# Patient Record
Sex: Female | Born: 2000 | Race: White | Hispanic: No | Marital: Single
Health system: Midwestern US, Community
[De-identification: ages and names within clinical notes are randomized; demographics above are authoritative.]

## PROBLEM LIST (undated history)

## (undated) DIAGNOSIS — D649 Anemia, unspecified: Secondary | ICD-10-CM

## (undated) DIAGNOSIS — R002 Palpitations: Secondary | ICD-10-CM

## (undated) DIAGNOSIS — S060X9A Concussion with loss of consciousness of unspecified duration, initial encounter: Secondary | ICD-10-CM

## (undated) HISTORY — PX: HAND SURGERY: SHX662

## (undated) HISTORY — DX: Palpitations: R00.2

## (undated) HISTORY — DX: Anemia, unspecified: D64.9

---
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
## 2018-03-23 ENCOUNTER — Other Ambulatory Visit: Payer: Self-pay | Admitting: Physician Assistant

## 2018-03-23 ENCOUNTER — Ambulatory Visit (INDEPENDENT_AMBULATORY_CARE_PROVIDER_SITE_OTHER): Payer: Managed Care, Other (non HMO)

## 2018-03-23 DIAGNOSIS — R002 Palpitations: Secondary | ICD-10-CM

## 2018-06-21 DIAGNOSIS — K011 Impacted teeth: Secondary | ICD-10-CM | POA: Diagnosis not present

## 2018-06-21 DIAGNOSIS — K006 Disturbances in tooth eruption: Secondary | ICD-10-CM | POA: Diagnosis not present

## 2018-09-25 DIAGNOSIS — Z30017 Encounter for initial prescription of implantable subdermal contraceptive: Secondary | ICD-10-CM | POA: Diagnosis not present

## 2018-09-25 DIAGNOSIS — Z3202 Encounter for pregnancy test, result negative: Secondary | ICD-10-CM | POA: Diagnosis not present

## 2018-09-26 ENCOUNTER — Telehealth: Payer: Self-pay | Admitting: Cardiovascular Disease

## 2018-09-26 NOTE — Telephone Encounter (Signed)
error 

## 2018-10-01 ENCOUNTER — Ambulatory Visit: Payer: Managed Care, Other (non HMO) | Admitting: Cardiovascular Disease

## 2018-10-24 ENCOUNTER — Telehealth: Payer: Self-pay | Admitting: *Deleted

## 2018-10-24 NOTE — Telephone Encounter (Signed)
Called pt to prescreen and she advised that she has already seen another Cardiologist and just to cancel the apt.

## 2018-10-25 ENCOUNTER — Ambulatory Visit: Payer: Managed Care, Other (non HMO) | Admitting: Cardiology

## 2018-10-31 ENCOUNTER — Emergency Department: Admit: 2018-10-31 | Payer: BLUE CROSS/BLUE SHIELD | Primary: Family Medicine

## 2018-10-31 ENCOUNTER — Inpatient Hospital Stay
Admit: 2018-10-31 | Discharge: 2018-11-01 | Disposition: A | Discharge status: Home / Self Care | Payer: BLUE CROSS/BLUE SHIELD | Attending: Emergency Medicine

## 2018-10-31 DIAGNOSIS — R0789 Other chest pain: Secondary | ICD-10-CM

## 2018-10-31 LAB — DRUG SCREEN, URINE
AMPHETAMINES: NEGATIVE
Amphetamine Screen, Urine: NEGATIVE
BARBITURATES: NEGATIVE
BENZODIAZEPINES: NEGATIVE
Barbiturate Screen, Urine: NEGATIVE
Benzodiazepine Screen, Urine: NEGATIVE
COCAINE: NEGATIVE
Cocaine Screen Urine: NEGATIVE
METHADONE: NEGATIVE
Methadone Screen, Urine: NEGATIVE
OPIATES: NEGATIVE
Opiate Screen, Urine: NEGATIVE
PCP Screen, Urine: NEGATIVE
PCP(PHENCYCLIDINE): NEGATIVE
THC (TH-CANNABINOL): NEGATIVE
THC Screen, Urine: NEGATIVE

## 2018-10-31 LAB — COMPREHENSIVE METABOLIC PANEL
ALT: 15 U/L (ref 12–78)
AST: 8 U/L — ABNORMAL LOW (ref 15–37)
Albumin/Globulin Ratio: 1.2 (ref 1.1–2.2)
Albumin: 4.3 g/dL (ref 3.5–5.0)
Alkaline Phosphatase: 77 U/L (ref 45–117)
Anion Gap: 3 mmol/L — ABNORMAL LOW (ref 5–15)
BUN: 10 MG/DL (ref 6–20)
Bun/Cre Ratio: 11 — ABNORMAL LOW (ref 12–20)
CO2: 28 mmol/L (ref 21–32)
Calcium: 9.1 MG/DL (ref 8.5–10.1)
Chloride: 107 mmol/L (ref 97–108)
Creatinine: 0.89 MG/DL (ref 0.55–1.02)
EGFR IF NonAfrican American: >60 mL/min/{1.73_m2} (ref >60)
GFR African American: >60 mL/min/{1.73_m2} (ref >60)
Globulin: 3.7 g/dL (ref 2.0–4.0)
Glucose: 97 mg/dL (ref 65–100)
Potassium: 4.1 mmol/L (ref 3.5–5.1)
Sodium: 138 mmol/L (ref 136–145)
Total Bilirubin: 0.2 MG/DL (ref 0.2–1.0)
Total Protein: 8 g/dL (ref 6.4–8.2)

## 2018-10-31 LAB — CBC WITH AUTO DIFFERENTIAL
Basophils %: 1 % (ref 0–1)
Basophils Absolute: 0 10*3/uL (ref 0.0–0.1)
Eosinophils %: 3 % (ref 0–7)
Eosinophils Absolute: 0.1 10*3/uL (ref 0.0–0.4)
Granulocyte Absolute Count: 0 10*3/uL (ref 0.00–0.04)
Hematocrit: 39.7 % (ref 35.0–47.0)
Hemoglobin: 12.3 g/dL (ref 11.5–16.0)
Immature Granulocytes: 0 % (ref 0.0–0.5)
Lymphocytes %: 30 % (ref 12–49)
Lymphocytes Absolute: 1.5 10*3/uL (ref 0.8–3.5)
MCH: 24.8 PG — ABNORMAL LOW (ref 26.0–34.0)
MCHC: 31 g/dL (ref 30.0–36.5)
MCV: 80.2 FL (ref 80.0–99.0)
MPV: 10.5 FL (ref 8.9–12.9)
Monocytes %: 8 % (ref 5–13)
Monocytes Absolute: 0.4 10*3/uL (ref 0.0–1.0)
NRBC Absolute: 0 10*3/uL (ref 0.00–0.01)
Neutrophils %: 58 % (ref 32–75)
Neutrophils Absolute: 2.9 10*3/uL (ref 1.8–8.0)
Nucleated RBCs: 0 PER 100 WBC
Platelets: 282 10*3/uL (ref 150–400)
RBC: 4.95 M/uL (ref 3.80–5.20)
RDW: 14.6 % — ABNORMAL HIGH (ref 11.5–14.5)
WBC: 5 10*3/uL (ref 3.6–11.0)

## 2018-10-31 LAB — CK W/ REFLX CKMB
CK: 105 U/L (ref 26–192)
CK: 105 U/L (ref 26–192)

## 2018-10-31 LAB — ETHYL ALCOHOL
ALCOHOL(ETHYL),SERUM: <10 MG/DL (ref <10)
Ethyl Alcohol: <10 MG/DL (ref <10)

## 2018-10-31 LAB — TROPONIN: Troponin I: <0.05 ng/mL (ref <0.05)

## 2018-10-31 LAB — D-DIMER, QUANTITATIVE: D-Dimer, Quant: 0.22 mg/L FEU (ref 0.00–0.65)

## 2018-10-31 LAB — CBC WITH AUTOMATED DIFF
ABS. BASOPHILS: 0 10*3/uL (ref 0.0–0.1)
ABS. EOSINOPHILS: 0.1 10*3/uL (ref 0.0–0.4)
ABS. IMM. GRANS.: 0 10*3/uL (ref 0.00–0.04)
ABS. LYMPHOCYTES: 1.5 10*3/uL (ref 0.8–3.5)
ABS. MONOCYTES: 0.4 10*3/uL (ref 0.0–1.0)
ABS. NEUTROPHILS: 2.9 10*3/uL (ref 1.8–8.0)
ABSOLUTE NRBC: 0 10*3/uL (ref 0.00–0.01)
BASOPHILS: 1 % (ref 0–1)
EOSINOPHILS: 3 % (ref 0–7)
HCT: 39.7 % (ref 35.0–47.0)
HGB: 12.3 g/dL (ref 11.5–16.0)
IMMATURE GRANULOCYTES: 0 % (ref 0.0–0.5)
LYMPHOCYTES: 30 % (ref 12–49)
MCH: 24.8 PG — ABNORMAL LOW (ref 26.0–34.0)
MCHC: 31 g/dL (ref 30.0–36.5)
MCV: 80.2 FL (ref 80.0–99.0)
MONOCYTES: 8 % (ref 5–13)
MPV: 10.5 FL (ref 8.9–12.9)
NEUTROPHILS: 58 % (ref 32–75)
NRBC: 0 PER 100 WBC
PLATELET: 282 10*3/uL (ref 150–400)
RBC: 4.95 M/uL (ref 3.80–5.20)
RDW: 14.6 % — ABNORMAL HIGH (ref 11.5–14.5)
WBC: 5 10*3/uL (ref 3.6–11.0)

## 2018-10-31 LAB — METABOLIC PANEL, COMPREHENSIVE
A-G Ratio: 1.2 (ref 1.1–2.2)
ALT (SGPT): 15 U/L (ref 12–78)
AST (SGOT): 8 U/L — ABNORMAL LOW (ref 15–37)
Albumin: 4.3 g/dL (ref 3.5–5.0)
Alk. phosphatase: 77 U/L (ref 45–117)
Anion gap: 3 mmol/L — ABNORMAL LOW (ref 5–15)
BUN/Creatinine ratio: 11 — ABNORMAL LOW (ref 12–20)
BUN: 10 MG/DL (ref 6–20)
Bilirubin, total: 0.2 MG/DL (ref 0.2–1.0)
CO2: 28 mmol/L (ref 21–32)
Calcium: 9.1 MG/DL (ref 8.5–10.1)
Chloride: 107 mmol/L (ref 97–108)
Creatinine: 0.89 MG/DL (ref 0.55–1.02)
GFR est AA: >60 mL/min/{1.73_m2} (ref >60)
GFR est non-AA: >60 mL/min/{1.73_m2} (ref >60)
Globulin: 3.7 g/dL (ref 2.0–4.0)
Glucose: 97 mg/dL (ref 65–100)
Potassium: 4.1 mmol/L (ref 3.5–5.1)
Protein, total: 8 g/dL (ref 6.4–8.2)
Sodium: 138 mmol/L (ref 136–145)

## 2018-10-31 LAB — TROPONIN I: Troponin-I, Qt.: <0.05 ng/mL (ref <0.05)

## 2018-10-31 LAB — D DIMER: D-dimer: 0.22 mg/L FEU (ref 0.00–0.65)

## 2018-10-31 NOTE — ED Notes (Signed)
Patient was provided with discharge instructions. Instructions and any medications were reviewed with the patient &/or family by Dr. Keefer. Questions and concerns addressed by the provider. Patient discharged in stable condition via ambulation and was escorted by Friend.

## 2018-10-31 NOTE — ED Provider Notes (Signed)
EMERGENCY DEPARTMENT HISTORY AND PHYSICAL EXAM      Date: 10/31/2018  Patient Name: Maureen Stevens    History of Presenting Illness     Chief Complaint   Patient presents with   ??? Chest Pain     Patient presents to the ED complaining of worsening left sided chest pain. Patient is currenly wearing a Holter monitor and under the care of Verl Dicker, MD, [facility]. Patient acknowledges dizziness and nausea.        History Provided By: Patient    HPI: Maureen Stevens, 18 y.o. female without significant medical history presents to the ED with cc of chest pain.  Patient complains of an atypical chest pain described as electrical shock sensations that will occur intermittently.  She states that symptoms started in January of this year and that they started out occurring only once every other week.  Now she is having approximately 8 episodes a day.  She states that she feels that she is gasping for air and she feels her heart racing afterwards.  Her friend at bedside states that she has exercise fatigue, dyspnea on exertion, and general lack of energy.  Normal appetite.  She states that she has also felt confused recently.  She denies any fevers, chills, head injury, recent illness.  She denies abdominal pain, nausea, vomiting, or significant medical or surgical history.  She has been following with Dr. Daniel Nones, cardiology, and is currently being evaluated by Holter monitor which she is wearing.  She denies any alcohol use or drug use.  Patient states that she has been evaluated by multiple doctors for this condition and has not received any answer, they come to the ED today as her symptoms are worsening.    There are no other complaints, changes, or physical findings at this time.    PCP: Other, Phys, MD    No current facility-administered medications on file prior to encounter.      No current outpatient medications on file prior to encounter.       Past History     Past Medical History:   History reviewed. No pertinent past medical history.    Past Surgical History:  History reviewed. No pertinent surgical history.    Family History:  History reviewed. No pertinent family history.    Social History:  Social History     Tobacco Use   ??? Smoking status: Not on file   Substance Use Topics   ??? Alcohol use: Not on file   ??? Drug use: Not on file       Allergies:  No Known Allergies      Review of Systems   Review of Systems   Constitutional: Positive for fatigue. Negative for chills and fever.   HENT: Negative.    Eyes: Negative for visual disturbance.   Respiratory: Positive for shortness of breath. Negative for cough.    Cardiovascular: Positive for chest pain. Negative for leg swelling.   Gastrointestinal: Negative for abdominal pain, nausea and vomiting.   Genitourinary: Negative.    Musculoskeletal: Negative for back pain and gait problem.   Skin: Negative for color change and rash.   Neurological: Negative for dizziness, weakness, light-headedness and headaches.   Hematological: Does not bruise/bleed easily.   Psychiatric/Behavioral: Positive for confusion.   All other systems reviewed and are negative.      Physical Exam   Physical Exam  Vitals signs and nursing note reviewed.   Constitutional:       General:  She is not in acute distress.     Appearance: Normal appearance. She is not ill-appearing or toxic-appearing.      Comments: Patient appears intoxicated with slurred speech   HENT:      Head: Normocephalic and atraumatic.      Nose: Nose normal.      Mouth/Throat:      Mouth: Mucous membranes are moist.   Eyes:      Extraocular Movements: Extraocular movements intact.      Pupils: Pupils are equal, round, and reactive to light.   Neck:      Musculoskeletal: Normal range of motion and neck supple.   Cardiovascular:      Rate and Rhythm: Normal rate and regular rhythm.      Heart sounds: No murmur.   Pulmonary:      Effort: Pulmonary effort is normal. No respiratory distress.       Breath sounds: Normal breath sounds. No wheezing.   Abdominal:      General: There is no distension.      Palpations: Abdomen is soft.      Tenderness: There is no abdominal tenderness. There is no guarding or rebound.   Musculoskeletal: Normal range of motion.         General: No swelling or tenderness.      Right lower leg: No edema.      Left lower leg: No edema.   Skin:     General: Skin is warm and dry.      Coloration: Skin is not pale.      Findings: No erythema.   Neurological:      General: No focal deficit present.      Mental Status: She is alert and oriented to person, place, and time.         Diagnostic Study Results     Labs -     Recent Results (from the past 12 hour(s))   EKG, 12 LEAD, INITIAL    Collection Time: 10/31/18  2:33 PM   Result Value Ref Range    Ventricular Rate 82 BPM    Atrial Rate 82 BPM    P-R Interval 122 ms    QRS Duration 78 ms    Q-T Interval 364 ms    QTC Calculation (Bezet) 425 ms    Calculated P Axis 53 degrees    Calculated R Axis 89 degrees    Calculated T Axis 47 degrees    Diagnosis       Normal sinus rhythm  Normal ECG  No previous ECGs available     CBC WITH AUTOMATED DIFF    Collection Time: 10/31/18  2:55 PM   Result Value Ref Range    WBC 5.0 3.6 - 11.0 K/uL    RBC 4.95 3.80 - 5.20 M/uL    HGB 12.3 11.5 - 16.0 g/dL    HCT 39.7 35.0 - 47.0 %    MCV 80.2 80.0 - 99.0 FL    MCH 24.8 (L) 26.0 - 34.0 PG    MCHC 31.0 30.0 - 36.5 g/dL    RDW 14.6 (H) 11.5 - 14.5 %    PLATELET 282 150 - 400 K/uL    MPV 10.5 8.9 - 12.9 FL    NRBC 0.0 0 PER 100 WBC    ABSOLUTE NRBC 0.00 0.00 - 0.01 K/uL    NEUTROPHILS 58 32 - 75 %    LYMPHOCYTES 30 12 - 49 %    MONOCYTES 8 5 -  13 %    EOSINOPHILS 3 0 - 7 %    BASOPHILS 1 0 - 1 %    IMMATURE GRANULOCYTES 0 0.0 - 0.5 %    ABS. NEUTROPHILS 2.9 1.8 - 8.0 K/UL    ABS. LYMPHOCYTES 1.5 0.8 - 3.5 K/UL    ABS. MONOCYTES 0.4 0.0 - 1.0 K/UL    ABS. EOSINOPHILS 0.1 0.0 - 0.4 K/UL    ABS. BASOPHILS 0.0 0.0 - 0.1 K/UL     ABS. IMM. GRANS. 0.0 0.00 - 0.04 K/UL    DF AUTOMATED     METABOLIC PANEL, COMPREHENSIVE    Collection Time: 10/31/18  2:55 PM   Result Value Ref Range    Sodium 138 136 - 145 mmol/L    Potassium 4.1 3.5 - 5.1 mmol/L    Chloride 107 97 - 108 mmol/L    CO2 28 21 - 32 mmol/L    Anion gap 3 (L) 5 - 15 mmol/L    Glucose 97 65 - 100 mg/dL    BUN 10 6 - 20 MG/DL    Creatinine 0.89 0.55 - 1.02 MG/DL    BUN/Creatinine ratio 11 (L) 12 - 20      GFR est AA >60 >60 ml/min/1.40m    GFR est non-AA >60 >60 ml/min/1.738m   Calcium 9.1 8.5 - 10.1 MG/DL    Bilirubin, total 0.2 0.2 - 1.0 MG/DL    ALT (SGPT) 15 12 - 78 U/L    AST (SGOT) 8 (L) 15 - 37 U/L    Alk. phosphatase 77 45 - 117 U/L    Protein, total 8.0 6.4 - 8.2 g/dL    Albumin 4.3 3.5 - 5.0 g/dL    Globulin 3.7 2.0 - 4.0 g/dL    A-G Ratio 1.2 1.1 - 2.2     CK W/ REFLX CKMB    Collection Time: 10/31/18  2:55 PM   Result Value Ref Range    CK 105 26 - 192 U/L   TROPONIN I    Collection Time: 10/31/18  2:55 PM   Result Value Ref Range    Troponin-I, Qt. <0.05 <0.05 ng/mL   D DIMER    Collection Time: 10/31/18  4:19 PM   Result Value Ref Range    D-dimer 0.22 0.00 - 0.65 mg/L FEU   ETHYL ALCOHOL    Collection Time: 10/31/18  4:19 PM   Result Value Ref Range    ALCOHOL(ETHYL),SERUM <10 <10 MG/DL   DRUG SCREEN, URINE    Collection Time: 10/31/18  6:33 PM   Result Value Ref Range    AMPHETAMINES Negative NEG      BARBITURATES Negative NEG      BENZODIAZEPINES Negative NEG      COCAINE Negative NEG      METHADONE Negative NEG      OPIATES Negative NEG      PCP(PHENCYCLIDINE) Negative NEG      THC (TH-CANNABINOL) Negative NEG      Drug screen comment (NOTE)        Radiologic Studies -   CT HEAD WO CONT   Final Result      XR CHEST PORT   Final Result   IMPRESSION: No acute findings.        CT Results  (Last 48 hours)               10/31/18 1741  CT HEAD WO CONT Final result    Impression:  IMPRESSION: No acute intracranial abnormality.  Narrative:  INDICATION:  Dizziness, headache       EXAM: CT HEAD WITHOUT CONTRAST.       COMPARISON: None.       PROCEDURE: Sequential axial images of the head were performed without   intravenous contrast. Soft tissue and bone windows were examined. Images were   reformatted in the sagittal and coronal planes.  .CT dose reduction was achieved   through use of a standardized protocol tailored for this examination and   automatic exposure control for dose modulation.        FINDINGS: The brain parenchyma and ventricular system are unremarkable in   appearance for age. There is no parenchymal mass or hemorrhage and no shift of   midline structures or extra-axial collection. No obvious acute ischemia. No bony   abnormality.               CXR Results  (Last 48 hours)               10/31/18 1706  XR CHEST PORT Final result    Impression:  IMPRESSION: No acute findings.       Narrative:  EXAM: XR CHEST PORT       INDICATION: Chest Pain       COMPARISON: None.       FINDINGS: A portable AP radiograph of the chest was obtained at 16:44 hours. The   patient is on a cardiac monitor. Battery pack/electronic device overlies the   upper left chest. The lungs are clear. The cardiac and mediastinal contours and   pulmonary vascularity are normal.  The bones and soft tissues are grossly within   normal limits.                  Medical Decision Making   I am the first provider for this patient.    I reviewed the vital signs, available nursing notes, past medical history, past surgical history, family history and social history.    Vital Signs-Reviewed the patient's vital signs.  Patient Vitals for the past 12 hrs:   Temp Pulse Resp BP SpO2   10/31/18 1919 97.3 ??F (36.3 ??C) 80 18 127/53 98 %   10/31/18 1815 ??? 88 19 99/61 99 %   10/31/18 1800 ??? 83 22 107/60 97 %   10/31/18 1745 ??? 80 22 117/67 99 %   10/31/18 1620 ??? 80 20 (!) 104/35 99 %   10/31/18 1432 98.2 ??F (36.8 ??C) 97 18 130/78 100 %       Records Reviewed: Nursing Notes     Provider Notes (Medical Decision Making):   This is a 18 year old female here with atypical electric-like chest pain episodes with associated shortness of breath and palpitations.  She is currently being evaluated by cardiology with a Holter monitor.  This has been a chronic condition for her that has been progressively worsening over the past few weeks.  Patient states that she does feel confused and clinically she does appear intoxicated.  She has no focal neurologic deficits and she is awake alert oriented x3.  Her vital signs demonstrate that she is afebrile and all vitals are stable throughout entire ED stay.  She will be monitored on a cardiac monitor to look for any events.  I will obtain broad work-up consisting of CBC, CMP, TSH, ammonia, blood alcohol level, UDS, urinalysis, CT head, chest x-ray, d-dimer.    I went back to reevaluate patient and she looks like a completely different person.  She is awake and alert and no longer has any of the somnolence or grogginess that was present on my initial evaluation.  Patient and her friend at bedside states that she gets like that when she is under stress and anxiety.  Patient has not had any further electrical shock episodes since that she has been here in the ED.  Her work-up today is unremarkable and demonstrates no significant leukocytosis, anemia, electrolyte abnormality, dehydration.  Chest x-ray is unremarkable.  D-dimer is negative and I do not feel this is related to a thromboembolus.  Troponin negative with nonischemic EKG.  Her symptoms do not appear consistent with seizure activity as she is responsive during these electrical shock episodes.  I question if this could be psychogenic related or may be even related to GERD.  Patient is going to follow-up with her PCP and she was given strict return ED precautions.    ED Course:   Initial assessment performed. The patients presenting problems have been  discussed, and they are in agreement with the care plan formulated and outlined with them.  I have encouraged them to ask questions as they arise throughout their visit.    ED Course as of Nov 01 27   Wed Oct 31, 2018   1549 EKG per my interpretation normal sinus rhythm, rate 82 bpm, normal axis, no acute ischemic changes, occasional PAC, no interval changes.    [AK]      ED Course User Index  [AK] Mariel Sleet, MD       Discharge Note:  The patient has been re-evaluated and is ready for discharge. Reviewed available results with patient. Counseled patient on diagnosis and care plan. Patient has expressed understanding, and all questions have been answered. Patient agrees with plan and agrees to follow up as recommended, or to return to the ED if their symptoms worsen. Discharge instructions have been provided and explained to the patient, along with reasons to return to the ED.      Disposition:  Discharge home    DISCHARGE PLAN:  1. There are no discharge medications for this patient.    2.   Follow-up Information     Follow up With Specialties Details Why Contact Info    Verl Dicker, MD Cardiology Call   [address]  [address]  [place] [address]  [phone]      [facility] Schedule an appointment as soon as possible for a visit  to establish with a PCP [address]  [place]  [phone]        3.  Return to ED if worse     Diagnosis     Clinical Impression:   1. Atypical chest pain        Attestations:    Mariel Sleet, MD    Please note that this dictation was completed with Dragon, the computer voice recognition software.  Quite often unanticipated grammatical, syntax, homophones, and other interpretive errors are inadvertently transcribed by the computer software.  Please disregard these errors.  Please excuse any errors that have escaped final proofreading.  Thank you.

## 2018-10-31 NOTE — ED Notes (Addendum)
Pt reported  has been feeling an "electrical shock" moving from her chest to her left leg and upper left side of neck. Pt reported this has been an ongoing problem since Jan and is seeing a cardiologist. Pt reported it has gotten worse this week as the Pt is intermittent gasping for air, SOB, weakness, dizziness, extremely fatigue. Pt roommate reports Pt has trouble standing up, walking up steps without feeling SOB. Pt has a heart monitor on place about a week ago. Pt reports this is her second heart monitor test.     1600 MD at bedside evaluating PT     1724 Pt resting in POC with friend at bedside     1753 Pt seems to return back to her normal baseline of LOC and reporting she feels fine now. Pt is no longer drowsy and fatigued.

## 2018-10-31 NOTE — ED Notes (Signed)
Patient appears anxious and states she is ready to leave, refused discharge vital signs. Asked to have IV taken out so she can leave. Went over discharge paper work and patient walked out with roommate.

## 2018-10-31 NOTE — ED Notes (Signed)
Pt. Resting comfortably in bed at this time with call bell in reach.  PT. And family updated on plan of care.

## 2018-10-31 NOTE — ED Notes (Signed)
Provider at the bedside talking with patient/family.

## 2018-10-31 NOTE — ED Notes (Signed)
Report received from Lauren, RN. They advised of the patient's chief complaint, current status, orders completed (to include IV access/medications/radiology testing), outstanding orders that still need to be completed, and the treatment plan. Questions asked and answered prior to assumption of care.

## 2018-10-31 NOTE — ED Notes (Signed)
Pt. Off unit to CT.

## 2018-10-31 NOTE — ED Provider Notes (Signed)
ED Provider Notes by Mariel Sleet, MD at 10/31/18 1547                Author: Mariel Sleet, MD  Service: Emergency Medicine  Author Type: Physician       Filed: 11/01/18 0032  Date of Service: 10/31/18 1547  Status: Signed          Editor: Mariel Sleet, MD (Physician)               EMERGENCY DEPARTMENT HISTORY AND PHYSICAL EXAM           Date: 10/31/2018   Patient Name: Maureen Stevens        History of Presenting Illness          Chief Complaint       Patient presents with        ?  Chest Pain             Patient presents to the ED complaining of worsening left sided chest pain. Patient is currenly wearing a Holter monitor and under the care of Verl Dicker, MD, [facility] Patient acknowledges dizziness and nausea.            History Provided By: Patient      HPI: Maureen Stevens,  18 y.o. female without significant medical history presents to the ED with cc of chest pain.   Patient complains of an atypical chest pain described as electrical shock sensations that will occur intermittently.  She states that symptoms started in January of this year and that they started out occurring only once every other week.  Now she is  having approximately 8 episodes a day.  She states that she feels that she is gasping for air and she feels her heart racing afterwards.  Her friend at bedside states that she has exercise fatigue, dyspnea on exertion, and general lack of energy.  Normal  appetite.  She states that she has also felt confused recently.  She denies any fevers, chills, head injury, recent illness.  She denies abdominal pain, nausea, vomiting, or significant medical or surgical history.  She has been following with Dr. Daniel Nones,  cardiology, and is currently being evaluated by Holter monitor which she is wearing.  She denies any alcohol use or drug use.  Patient states that she has been evaluated by multiple doctors for this condition and has not received any answer, they come  to the ED today as her symptoms  are worsening.      There are no other complaints, changes, or physical findings at this time.      PCP: Other, Phys, MD        No current facility-administered medications on file prior to encounter.           No current outpatient medications on file prior to encounter.             Past History        Past Medical History:   History reviewed. No pertinent past medical history.      Past Surgical History:   History reviewed. No pertinent surgical history.      Family History:   History reviewed. No pertinent family history.      Social History:     Social History          Tobacco Use         ?  Smoking status:  Not on file  Substance Use Topics         ?  Alcohol use:  Not on file         ?  Drug use:  Not on file           Allergies:   No Known Allergies           Review of Systems     Review of Systems    Constitutional: Positive for fatigue. Negative for chills and fever.    HENT: Negative.     Eyes: Negative for visual disturbance.    Respiratory: Positive for shortness of breath. Negative for cough.     Cardiovascular: Positive for chest pain. Negative for leg swelling.    Gastrointestinal: Negative for abdominal pain, nausea and vomiting.    Genitourinary: Negative.     Musculoskeletal: Negative for back pain and gait problem.    Skin: Negative for color change and rash.    Neurological: Negative for dizziness, weakness, light-headedness and headaches.    Hematological: Does not bruise/bleed easily.    Psychiatric/Behavioral: Positive for confusion.    All other systems reviewed and are negative.           Physical Exam     Physical Exam   Vitals signs and nursing note reviewed.   Constitutional:        General: She is not in acute distress.     Appearance: Normal appearance. She is not ill-appearing or toxic-appearing.      Comments: Patient appears intoxicated with slurred speech    HENT :       Head: Normocephalic and atraumatic.      Nose: Nose normal.      Mouth/Throat:      Mouth: Mucous membranes  are moist.    Eyes:       Extraocular Movements: Extraocular movements intact.      Pupils: Pupils are equal, round, and reactive to light.    Neck:       Musculoskeletal: Normal range of motion and neck supple.   Cardiovascular :       Rate and Rhythm: Normal rate and regular rhythm.      Heart sounds: No murmur.    Pulmonary:       Effort: Pulmonary effort is normal. No respiratory distress.      Breath sounds: Normal breath sounds. No wheezing.    Abdominal:      General: There is no distension.      Palpations: Abdomen is soft.      Tenderness: There is no abdominal tenderness. There is no guarding or rebound.     Musculoskeletal: Normal range of motion.          General: No swelling or tenderness.      Right lower leg: No edema.      Left lower leg: No edema.    Skin:      General: Skin is warm and dry.      Coloration: Skin is not pale.      Findings: No erythema.    Neurological:       General: No focal deficit present.      Mental Status: She is alert and oriented to person, place, and time.              Diagnostic Study Results        Labs -         Recent Results (from the past 12 hour(s))  EKG, 12 LEAD, INITIAL          Collection Time: 10/31/18  2:33 PM         Result  Value  Ref Range            Ventricular Rate  82  BPM       Atrial Rate  82  BPM       P-R Interval  122  ms       QRS Duration  78  ms       Q-T Interval  364  ms       QTC Calculation (Bezet)  425  ms       Calculated P Axis  53  degrees       Calculated R Axis  89  degrees       Calculated T Axis  47  degrees       Diagnosis                 Normal sinus rhythm   Normal ECG   No previous ECGs available          CBC WITH AUTOMATED DIFF          Collection Time: 10/31/18  2:55 PM         Result  Value  Ref Range            WBC  5.0  3.6 - 11.0 K/uL       RBC  4.95  3.80 - 5.20 M/uL       HGB  12.3  11.5 - 16.0 g/dL       HCT  39.7  35.0 - 47.0 %       MCV  80.2  80.0 - 99.0 FL       MCH  24.8 (L)  26.0 - 34.0 PG       MCHC  31.0  30.0 -  36.5 g/dL       RDW  14.6 (H)  11.5 - 14.5 %       PLATELET  282  150 - 400 K/uL       MPV  10.5  8.9 - 12.9 FL       NRBC  0.0  0 PER 100 WBC       ABSOLUTE NRBC  0.00  0.00 - 0.01 K/uL       NEUTROPHILS  58  32 - 75 %       LYMPHOCYTES  30  12 - 49 %       MONOCYTES  8  5 - 13 %       EOSINOPHILS  3  0 - 7 %       BASOPHILS  1  0 - 1 %       IMMATURE GRANULOCYTES  0  0.0 - 0.5 %       ABS. NEUTROPHILS  2.9  1.8 - 8.0 K/UL       ABS. LYMPHOCYTES  1.5  0.8 - 3.5 K/UL       ABS. MONOCYTES  0.4  0.0 - 1.0 K/UL       ABS. EOSINOPHILS  0.1  0.0 - 0.4 K/UL       ABS. BASOPHILS  0.0  0.0 - 0.1 K/UL       ABS. IMM. GRANS.  0.0  0.00 - 0.04 K/UL       DF  AUTOMATED          METABOLIC  PANEL, COMPREHENSIVE          Collection Time: 10/31/18  2:55 PM         Result  Value  Ref Range            Sodium  138  136 - 145 mmol/L       Potassium  4.1  3.5 - 5.1 mmol/L       Chloride  107  97 - 108 mmol/L       CO2  28  21 - 32 mmol/L       Anion gap  3 (L)  5 - 15 mmol/L       Glucose  97  65 - 100 mg/dL       BUN  10  6 - 20 MG/DL       Creatinine  0.89  0.55 - 1.02 MG/DL       BUN/Creatinine ratio  11 (L)  12 - 20         GFR est AA  >60  >60 ml/min/1.40m       GFR est non-AA  >60  >60 ml/min/1.737m      Calcium  9.1  8.5 - 10.1 MG/DL       Bilirubin, total  0.2  0.2 - 1.0 MG/DL       ALT (SGPT)  15  12 - 78 U/L       AST (SGOT)  8 (L)  15 - 37 U/L       Alk. phosphatase  77  45 - 117 U/L       Protein, total  8.0  6.4 - 8.2 g/dL       Albumin  4.3  3.5 - 5.0 g/dL       Globulin  3.7  2.0 - 4.0 g/dL       A-G Ratio  1.2  1.1 - 2.2         CK W/ REFLX CKMB          Collection Time: 10/31/18  2:55 PM         Result  Value  Ref Range            CK  105  26 - 192 U/L       TROPONIN I          Collection Time: 10/31/18  2:55 PM         Result  Value  Ref Range            Troponin-I, Qt.  <0.05  <0.05 ng/mL       D DIMER          Collection Time: 10/31/18  4:19 PM         Result  Value  Ref Range            D-dimer  0.22  0.00 - 0.65  mg/L FEU       ETHYL ALCOHOL          Collection Time: 10/31/18  4:19 PM         Result  Value  Ref Range            ALCOHOL(ETHYL),SERUM  <10  <10 MG/DL       DRUG SCREEN, URINE          Collection Time: 10/31/18  6:33 PM         Result  Value  Ref Range  AMPHETAMINES  Negative  NEG         BARBITURATES  Negative  NEG         BENZODIAZEPINES  Negative  NEG         COCAINE  Negative  NEG         METHADONE  Negative  NEG         OPIATES  Negative  NEG         PCP(PHENCYCLIDINE)  Negative  NEG         THC (TH-CANNABINOL)  Negative  NEG              Drug screen comment  (NOTE)             Radiologic Studies -      CT HEAD WO CONT       Final Result            XR CHEST PORT       Final Result     IMPRESSION: No acute findings.                 CT Results   (Last 48 hours)                                    10/31/18 1741    CT HEAD WO CONT  Final result            Impression:    IMPRESSION: No acute intracranial abnormality.                              Narrative:    INDICATION:  Dizziness, headache             EXAM: CT HEAD WITHOUT CONTRAST.             COMPARISON: None.             PROCEDURE: Sequential axial images of the head were performed without      intravenous contrast. Soft tissue and bone windows were examined. Images were      reformatted in the sagittal and coronal planes.  .CT dose reduction was achieved      through use of a standardized protocol tailored for this examination and      automatic exposure control for dose modulation.              FINDINGS: The brain parenchyma and ventricular system are unremarkable in      appearance for age. There is no parenchymal mass or hemorrhage and no shift of      midline structures or extra-axial collection. No obvious acute ischemia. No bony      abnormality.                                 CXR Results   (Last 48 hours)                                    10/31/18 1706    XR CHEST PORT  Final result            Impression:    IMPRESSION: No acute findings.  Narrative:    EXAM: XR CHEST PORT             INDICATION: Chest Pain             COMPARISON: None.             FINDINGS: A portable AP radiograph of the chest was obtained at 16:44 hours. The      patient is on a cardiac monitor. Battery pack/electronic device overlies the      upper left chest. The lungs are clear. The cardiac and mediastinal contours and      pulmonary vascularity are normal.  The bones and soft tissues are grossly within      normal limits.                                     Medical Decision Making     I am the first provider for this patient.      I reviewed the vital signs, available nursing notes, past medical history, past surgical history, family history and social history.      Vital Signs-Reviewed the patient's vital signs.   Patient Vitals for the past 12 hrs:            Temp  Pulse  Resp  BP  SpO2            10/31/18 1919  97.3 ??F (36.3 ??C)  80  18  127/53  98 %            10/31/18 1815  --  88  19  99/61  99 %     10/31/18 1800  --  83  22  107/60  97 %     10/31/18 1745  --  80  22  117/67  99 %     10/31/18 1620  --  80  20  (!) 104/35  99 %            10/31/18 1432  98.2 ??F (36.8 ??C)  97  18  130/78  100 %           Records Reviewed: Nursing Notes      Provider Notes (Medical Decision Making):    This is a 18 year old female here with atypical electric-like chest pain episodes with associated shortness of breath and palpitations.  She is currently being evaluated by cardiology with a Holter  monitor.  This has been a chronic condition for her that has been progressively worsening over the past few weeks.  Patient states that she does feel confused and clinically she does appear intoxicated.  She has no focal neurologic deficits and she is  awake alert oriented x3.  Her vital signs demonstrate that she is afebrile and all vitals are stable throughout entire ED stay.  She will be monitored on a cardiac monitor to look for any events.  I will obtain broad work-up  consisting of CBC, CMP, TSH,  ammonia, blood alcohol level, UDS, urinalysis, CT head, chest x-ray, d-dimer.      I went back to reevaluate patient and she looks like a completely different person.  She is awake and alert and no longer has any of the somnolence or grogginess that was present on my initial evaluation.  Patient and her friend at bedside states that  she gets like that when she is under stress and anxiety.  Patient has not had any further  electrical shock episodes since that she has been here in the ED.  Her work-up today is unremarkable and demonstrates no significant leukocytosis, anemia, electrolyte  abnormality, dehydration.  Chest x-ray is unremarkable.  D-dimer is negative and I do not feel this is related to a thromboembolus.  Troponin negative with nonischemic EKG.  Her symptoms do not appear consistent with seizure activity as she is responsive  during these electrical shock episodes.  I question if this could be psychogenic related or may be even related to GERD.  Patient is going to follow-up with her PCP and she was given strict return ED precautions.      ED Course:    Initial assessment performed. The patients presenting problems have been discussed, and they are in agreement with the care plan formulated and outlined with them.  I have encouraged them to ask questions as they arise throughout their visit.        ED Course as of Nov 01 27       Wed Oct 31, 2018        1549  EKG per my interpretation normal sinus rhythm, rate 82 bpm, normal axis, no acute ischemic changes, occasional PAC, no interval changes.     [AK]              ED Course User Index   [AK] Mariel Sleet, MD           Discharge Note:   The patient has been re-evaluated and is ready for discharge. Reviewed available results with patient. Counseled patient on diagnosis and care plan. Patient has expressed understanding, and all questions  have been answered. Patient agrees with plan and agrees to follow up as recommended,  or to return to the ED if their symptoms worsen. Discharge instructions have been provided and explained to the patient, along with reasons to return to the ED.        Disposition:   Discharge home      DISCHARGE PLAN:   1. There are no discharge medications for this patient.      2.      Follow-up Information               Follow up With  Specialties  Details  Why  Contact Info              Verl Dicker, MD  Cardiology  Call     [address]   [address]   [place] [address]   [phone]                 [facility]  Schedule an appointment as soon as possible for a visit   to establish with a PCP  [address]   [place]   [phone]             3.  Return to ED if worse         Diagnosis        Clinical Impression:       1.  Atypical chest pain            Attestations:      Mariel Sleet, MD      Please note that this dictation was completed with Dragon, the computer voice recognition software.  Quite often unanticipated grammatical, syntax, homophones, and other interpretive errors are  inadvertently transcribed by the computer software.  Please disregard these errors.  Please excuse any errors that have escaped final proofreading.  Thank you.

## 2018-10-31 NOTE — ED Notes (Signed)
 Pt reported  has been feeling an electrical shock moving from her chest to her left leg and upper left side of neck. Pt reported this has been an ongoing problem since Jan and is seeing a cardiologist. Pt reported it has gotten worse this week as the Pt is intermittent gasping for air, SOB, weakness, dizziness, extremely fatigue. Pt roommate reports Pt has trouble standing up, walking up steps without feeling SOB. Pt has a heart monitor on place about a week ago. Pt reports this is her second heart monitor test.     1600 MD at bedside evaluating PT     1724 Pt resting in POC with friend at bedside     1753 Pt seems to return back to her normal baseline of LOC and reporting she feels fine now. Pt is no longer drowsy and fatigued.

## 2018-10-31 NOTE — ED Notes (Signed)
Pt. Resting comfortably in bed at this time with call bell in reach. PT. And family updated on plan of care.

## 2018-10-31 NOTE — ED Notes (Signed)
Patient was provided with discharge instructions. Instructions and any medications were reviewed with the patient &/or family by Dr. Jeannie Fend. Questions and concerns addressed by the provider. Patient discharged in stable condition via ambulation and was escorted by Friend.

## 2018-10-31 NOTE — ED Notes (Signed)
Patient appears anxious and states she is ready to leave, refused discharge vital signs. Asked to have IV taken out so she can leave. Went over discharge paper work and patient walked out with roommate.

## 2018-11-01 LAB — EKG 12-LEAD
Atrial Rate: 82 {beats}/min
Diagnosis: NORMAL
P Axis: 53 degrees
P-R Interval: 122 ms
Q-T Interval: 364 ms
QRS Duration: 78 ms
QTc Calculation (Bazett): 425 ms
R Axis: 89 degrees
T Axis: 47 degrees
Ventricular Rate: 82 {beats}/min

## 2018-11-01 LAB — EKG, 12 LEAD, INITIAL
Atrial Rate: 82 {beats}/min
Calculated P Axis: 53 degrees
Calculated R Axis: 89 degrees
Calculated T Axis: 47 degrees
Diagnosis: NORMAL
P-R Interval: 122 ms
Q-T Interval: 364 ms
QRS Duration: 78 ms
QTC Calculation (Bezet): 425 ms
Ventricular Rate: 82 {beats}/min

## 2019-05-06 DIAGNOSIS — Z6827 Body mass index (BMI) 27.0-27.9, adult: Secondary | ICD-10-CM | POA: Diagnosis not present

## 2019-05-06 DIAGNOSIS — N390 Urinary tract infection, site not specified: Secondary | ICD-10-CM | POA: Diagnosis not present

## 2019-05-06 DIAGNOSIS — Z113 Encounter for screening for infections with a predominantly sexual mode of transmission: Secondary | ICD-10-CM | POA: Diagnosis not present

## 2019-05-06 DIAGNOSIS — N3091 Cystitis, unspecified with hematuria: Secondary | ICD-10-CM | POA: Diagnosis not present

## 2019-05-06 DIAGNOSIS — Z01419 Encounter for gynecological examination (general) (routine) without abnormal findings: Secondary | ICD-10-CM | POA: Diagnosis not present

## 2019-05-07 DIAGNOSIS — Z01419 Encounter for gynecological examination (general) (routine) without abnormal findings: Secondary | ICD-10-CM | POA: Diagnosis not present

## 2019-10-21 ENCOUNTER — Emergency Department (HOSPITAL_COMMUNITY): Payer: BC Managed Care – PPO

## 2019-10-21 ENCOUNTER — Emergency Department (HOSPITAL_COMMUNITY)
Admission: EM | Admit: 2019-10-21 | Discharge: 2019-10-21 | Disposition: A | Discharge status: Home / Self Care | Payer: BC Managed Care – PPO | Attending: Emergency Medicine | Admitting: Emergency Medicine

## 2019-10-21 ENCOUNTER — Other Ambulatory Visit: Payer: Self-pay

## 2019-10-21 DIAGNOSIS — S060X1A Concussion with loss of consciousness of 30 minutes or less, initial encounter: Secondary | ICD-10-CM | POA: Diagnosis not present

## 2019-10-21 DIAGNOSIS — W228XXA Striking against or struck by other objects, initial encounter: Secondary | ICD-10-CM | POA: Insufficient documentation

## 2019-10-21 DIAGNOSIS — R9431 Abnormal electrocardiogram [ECG] [EKG]: Secondary | ICD-10-CM | POA: Diagnosis not present

## 2019-10-21 DIAGNOSIS — Y999 Unspecified external cause status: Secondary | ICD-10-CM | POA: Insufficient documentation

## 2019-10-21 DIAGNOSIS — R4182 Altered mental status, unspecified: Secondary | ICD-10-CM | POA: Diagnosis not present

## 2019-10-21 DIAGNOSIS — S199XXA Unspecified injury of neck, initial encounter: Secondary | ICD-10-CM | POA: Diagnosis not present

## 2019-10-21 DIAGNOSIS — Y939 Activity, unspecified: Secondary | ICD-10-CM | POA: Diagnosis not present

## 2019-10-21 DIAGNOSIS — S060X0A Concussion without loss of consciousness, initial encounter: Secondary | ICD-10-CM | POA: Diagnosis not present

## 2019-10-21 DIAGNOSIS — S0990XA Unspecified injury of head, initial encounter: Secondary | ICD-10-CM

## 2019-10-21 DIAGNOSIS — Y929 Unspecified place or not applicable: Secondary | ICD-10-CM | POA: Insufficient documentation

## 2019-10-21 LAB — COMPREHENSIVE METABOLIC PANEL
ALT: 26 U/L (ref 0–44)
AST: 24 U/L (ref 15–41)
Albumin: 4.2 g/dL (ref 3.5–5.0)
Alkaline Phosphatase: 49 U/L (ref 38–126)
Anion gap: 9 (ref 5–15)
BUN: 10 mg/dL (ref 6–20)
CO2: 22 mmol/L (ref 22–32)
Calcium: 9.5 mg/dL (ref 8.9–10.3)
Chloride: 105 mmol/L (ref 98–111)
Creatinine, Ser: 1.04 mg/dL — ABNORMAL HIGH (ref 0.44–1.00)
GFR calc Af Amer: >60 mL/min (ref >60)
GFR calc non Af Amer: >60 mL/min (ref >60)
Glucose, Bld: 103 mg/dL — ABNORMAL HIGH (ref 70–99)
Potassium: 3.8 mmol/L (ref 3.5–5.1)
Sodium: 136 mmol/L (ref 135–145)
Total Bilirubin: 0.5 mg/dL (ref 0.3–1.2)
Total Protein: 7 g/dL (ref 6.5–8.1)

## 2019-10-21 LAB — CBC
HCT: 41.5 % (ref 36.0–46.0)
Hemoglobin: 12.9 g/dL (ref 12.0–15.0)
MCH: 26.2 pg (ref 26.0–34.0)
MCHC: 31.1 g/dL (ref 30.0–36.0)
MCV: 84.3 fL (ref 80.0–100.0)
Platelets: 252 10*3/uL (ref 150–400)
RBC: 4.92 MIL/uL (ref 3.87–5.11)
RDW: 14.6 % (ref 11.5–15.5)
WBC: 7.5 10*3/uL (ref 4.0–10.5)
nRBC: 0 % (ref 0.0–0.2)

## 2019-10-21 LAB — I-STAT BETA HCG BLOOD, ED (MC, WL, AP ONLY): I-stat hCG, quantitative: <5 m[IU]/mL (ref <5)

## 2019-10-21 LAB — CBG MONITORING, ED: Glucose-Capillary: 102 mg/dL — ABNORMAL HIGH (ref 70–99)

## 2019-10-21 MED ORDER — SODIUM CHLORIDE 0.9% FLUSH: 3.0000 mL | Freq: Once | INTRAVENOUS | Status: DC

## 2019-10-21 MED ORDER — HYDROCODONE-ACETAMINOPHEN 5-325 MG PO TABS: 1.0000 | ORAL_TABLET | Freq: Four times a day (QID) | ORAL | 0 refills | Status: DC | PRN

## 2019-10-21 MED ORDER — HYDROCODONE-ACETAMINOPHEN 5-325 MG PO TABS
1.0000 | ORAL_TABLET | Freq: Once | ORAL | Status: AC
  Administered 2019-10-21: 1 via ORAL
  Filled 2019-10-21: qty 1
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
## 2019-10-21 NOTE — Discharge Instructions (Signed)
Follow-up with your primary care provider. Return to the ER for worsening headache, blurry vision, numbness in arms or legs, additional head injuries or loss of consciousness.
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
## 2019-11-06 ENCOUNTER — Other Ambulatory Visit: Payer: Self-pay

## 2019-11-06 ENCOUNTER — Encounter: Payer: Self-pay | Admitting: Family Medicine

## 2019-11-06 ENCOUNTER — Ambulatory Visit (INDEPENDENT_AMBULATORY_CARE_PROVIDER_SITE_OTHER): Payer: BC Managed Care – PPO | Admitting: Family Medicine

## 2019-11-06 VITALS — BP 100/68 | HR 80 | Ht 66.5 in | Wt 180.8 lb

## 2019-11-06 DIAGNOSIS — F419 Anxiety disorder, unspecified: Secondary | ICD-10-CM

## 2019-11-06 DIAGNOSIS — F329 Major depressive disorder, single episode, unspecified: Secondary | ICD-10-CM

## 2019-11-06 DIAGNOSIS — F32A Depression, unspecified: Secondary | ICD-10-CM

## 2019-11-06 DIAGNOSIS — S060X0A Concussion without loss of consciousness, initial encounter: Secondary | ICD-10-CM | POA: Diagnosis not present

## 2019-11-06 MED ORDER — FLUOXETINE HCL 20 MG PO CAPS
20.0000 mg | ORAL_CAPSULE | Freq: Every day | ORAL | 3 refills | Status: AC
Start: 2019-11-06 — End: ?

## 2019-11-06 NOTE — Patient Instructions (Signed)
Thank you for coming in today.  Plan Talk therapy and prozac.  Recheck with me prior to leaving for college in early September.  Keep me updated.  Return sooner if needed.    Cognitive Behavioral Therapy Cognitive behavioral therapy (CBT) is a short-term, goal-oriented type of talk therapy. CBT can help you:  Identify patterns of thinking, feeling, and behaving that are causing you problems.  Decide how you want to think, feel, and respond to life events.  Set goals to change the beliefs and thoughts that cause you to act in ways that are not helpful for you.  Follow up on the changes that you make. What are the different types of CBT? The different types of CBT include:  Dialectical behavioral therapy (DBT). This approach is often used in group therapy, and it aids a person in managing behavior by focusing on: ? Things that cause problems to start (triggers). ? Methods of self-calming. ? Re-evaluating thinking processes.  Mindfulness-based cognitive therapy. This approach involves focusing your attention, meditating, and developing awareness of the present moment (mindfulness).  Rational emotive behavior therapy. This approach uses rational thought to reframe your thinking so it is less judgmental. Your therapist may directly challenge your thought processes.  Stress inoculation training. This approach involves planning ahead for stressful situations by practicing new thoughts and behaviors. This planning can help you avoid going back to old actions.  Acceptance and commitment therapy (ACT). This approach focuses on accepting yourself as you are and practicing mindfulness. It helps you understand what you would like to change and how you can set goals in that direction. What conditions is CBT used to treat? CBT may help to treat:  Mental health conditions, including: ? Depression. ? Anxiety. ? Bipolar disorder. ? Eating disorders. ? Post-traumatic stress disorder  (PTSD). ? Obsessive-compulsive disorder (OCD).  Insomnia and other sleep disorders.  Pain.  Stress.  Coping with loss or grief.  Coping with a difficult medical diagnosis or illness.  Relationship problems.  Emotional distress or shock (trauma). How can CBT help me? CBT may:  Give you a chance to share your thoughts, feelings, problems, and fears in a safe space.  Help you focus on specific problems.  Give you homework that helps you put theory into practice. Homework may include keeping a journal or doing thinking exercises.  Help you become aware of your patterns of thinking, feeling, and behaving, and how those three patterns affect each other.  Change your thoughts so that you can change your behaviors.  Help you chose how you want to view the world.  Teach you planned coping skills and offer better ways to deal with stress and difficult situations. To make the most of CBT, make sure you:  Find a licensed therapist whom you trust.  Take an active part in your therapy and do the homework that you are given.  Are honest about your problems.  Avoid skipping your therapy sessions. Summary  Cognitive behavioral therapy (CBT) is a short-term, goal-oriented type of talk therapy.  CBT can help you become aware of your patterns and the relationships among your thoughts, feelings, and behavior.  CBT may help mental health conditions and other problems. This information is not intended to replace advice given to you by your health care provider. Make sure you discuss any questions you have with your health care provider. Document Revised: 12/05/2018 Document Reviewed: 07/26/2016 Elsevier Patient Education  2020 Elsevier Inc.   Fluoxetine capsules or tablets (Depression/Mood Disorders) What is  this medicine? FLUOXETINE (floo OX e teen) belongs to a class of drugs known as selective serotonin reuptake inhibitors (SSRIs). It helps to treat mood problems such as  depression, obsessive compulsive disorder, and panic attacks. It can also treat certain eating disorders. This medicine may be used for other purposes; ask your health care provider or pharmacist if you have questions. COMMON BRAND NAME(S): Prozac What should I tell my health care provider before I take this medicine? They need to know if you have any of these conditions:  bipolar disorder or a family history of bipolar disorder  bleeding disorders  glaucoma  heart disease  liver disease  low levels of sodium in the blood  seizures  suicidal thoughts, plans, or attempt; a previous suicide attempt by you or a family member  take MAOIs like Carbex, Eldepryl, Marplan, Nardil, and Parnate  take medicines that treat or prevent blood clots  thyroid disease  an unusual or allergic reaction to fluoxetine, other medicines, foods, dyes, or preservatives  pregnant or trying to get pregnant  breast-feeding How should I use this medicine? Take this medicine by mouth with a glass of water. Follow the directions on the prescription label. You can take this medicine with or without food. Take your medicine at regular intervals. Do not take it more often than directed. Do not stop taking this medicine suddenly except upon the advice of your doctor. Stopping this medicine too quickly may cause serious side effects or your condition may worsen. A special MedGuide will be given to you by the pharmacist with each prescription and refill. Be sure to read this information carefully each time. Talk to your pediatrician regarding the use of this medicine in children. While this drug may be prescribed for children as young as 7 years for selected conditions, precautions do apply. Overdosage: If you think you have taken too much of this medicine contact a poison control center or emergency room at once. NOTE: This medicine is only for you. Do not share this medicine with others. What if I miss a  dose? If you miss a dose, skip the missed dose and go back to your regular dosing schedule. Do not take double or extra doses. What may interact with this medicine? Do not take this medicine with any of the following medications:  other medicines containing fluoxetine, like Sarafem or Symbyax  cisapride  dronedarone  linezolid  MAOIs like Carbex, Eldepryl, Marplan, Nardil, and Parnate  methylene blue (injected into a vein)  pimozide  thioridazine This medicine may also interact with the following medications:  alcohol  amphetamines  aspirin and aspirin-like medicines  carbamazepine  certain medicines for depression, anxiety, or psychotic disturbances  certain medicines for migraine headaches like almotriptan, eletriptan, frovatriptan, naratriptan, rizatriptan, sumatriptan, zolmitriptan  digoxin  diuretics  fentanyl  flecainide  furazolidone  isoniazid  lithium  medicines for sleep  medicines that treat or prevent blood clots like warfarin, enoxaparin, and dalteparin  NSAIDs, medicines for pain and inflammation, like ibuprofen or naproxen  other medicines that prolong the QT interval (an abnormal heart rhythm)  phenytoin  procarbazine  propafenone  rasagiline  ritonavir  supplements like St. John's wort, kava kava, valerian  tramadol  tryptophan  vinblastine This list may not describe all possible interactions. Give your health care provider a list of all the medicines, herbs, non-prescription drugs, or dietary supplements you use. Also tell them if you smoke, drink alcohol, or use illegal drugs. Some items may interact with your medicine.  What should I watch for while using this medicine? Tell your doctor if your symptoms do not get better or if they get worse. Visit your doctor or health care professional for regular checks on your progress. Because it may take several weeks to see the full effects of this medicine, it is important to  continue your treatment as prescribed by your doctor. Patients and their families should watch out for new or worsening thoughts of suicide or depression. Also watch out for sudden changes in feelings such as feeling anxious, agitated, panicky, irritable, hostile, aggressive, impulsive, severely restless, overly excited and hyperactive, or not being able to sleep. If this happens, especially at the beginning of treatment or after a change in dose, call your health care professional. Bonita Quin may get drowsy or dizzy. Do not drive, use machinery, or do anything that needs mental alertness until you know how this medicine affects you. Do not stand or sit up quickly, especially if you are an older patient. This reduces the risk of dizzy or fainting spells. Alcohol may interfere with the effect of this medicine. Avoid alcoholic drinks. Your mouth may get dry. Chewing sugarless gum or sucking hard candy, and drinking plenty of water may help. Contact your doctor if the problem does not go away or is severe. This medicine may affect blood sugar levels. If you have diabetes, check with your doctor or health care professional before you change your diet or the dose of your diabetic medicine. What side effects may I notice from receiving this medicine? Side effects that you should report to your doctor or health care professional as soon as possible:  allergic reactions like skin rash, itching or hives, swelling of the face, lips, or tongue  anxious  black, tarry stools  breathing problems  changes in vision  confusion  elevated mood, decreased need for sleep, racing thoughts, impulsive behavior  eye pain  fast, irregular heartbeat  feeling faint or lightheaded, falls  feeling agitated, angry, or irritable  hallucination, loss of contact with reality  loss of balance or coordination  loss of memory  painful or prolonged erections  restlessness, pacing, inability to keep  still  seizures  stiff muscles  suicidal thoughts or other mood changes  trouble sleeping  unusual bleeding or bruising  unusually weak or tired  vomiting Side effects that usually do not require medical attention (report to your doctor or health care professional if they continue or are bothersome):  change in appetite or weight  change in sex drive or performance  diarrhea  dry mouth  headache  increased sweating  nausea  tremors This list may not describe all possible side effects. Call your doctor for medical advice about side effects. You may report side effects to FDA at 1-800-FDA-1088. Where should I keep my medicine? Keep out of the reach of children. Store at room temperature between 15 and 30 degrees C (59 and 86 degrees F). Throw away any unused medicine after the expiration date. NOTE: This sheet is a summary. It may not cover all possible information. If you have questions about this medicine, talk to your doctor, pharmacist, or health care provider.  2020 Elsevier/Gold Standard (2017-11-02 11:56:53)
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
## 2019-11-28 ENCOUNTER — Ambulatory Visit: Payer: BC Managed Care – PPO | Admitting: Family Medicine

## 2019-11-28 ENCOUNTER — Ambulatory Visit: Payer: BC Managed Care – PPO | Admitting: Psychology
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
## 2019-12-27 ENCOUNTER — Ambulatory Visit: Payer: BC Managed Care – PPO | Admitting: Diagnostic Neuroimaging

## 2020-03-25 DIAGNOSIS — Z8709 Personal history of other diseases of the respiratory system: Secondary | ICD-10-CM | POA: Diagnosis not present

## 2020-04-10 DIAGNOSIS — Z8709 Personal history of other diseases of the respiratory system: Secondary | ICD-10-CM | POA: Diagnosis not present

## 2020-04-10 DIAGNOSIS — J342 Deviated nasal septum: Secondary | ICD-10-CM | POA: Diagnosis not present

## 2020-04-10 DIAGNOSIS — J3501 Chronic tonsillitis: Secondary | ICD-10-CM | POA: Diagnosis not present

## 2020-04-10 DIAGNOSIS — Z20822 Contact with and (suspected) exposure to covid-19: Secondary | ICD-10-CM | POA: Diagnosis not present

## 2020-04-10 DIAGNOSIS — J358 Other chronic diseases of tonsils and adenoids: Secondary | ICD-10-CM | POA: Diagnosis not present

## 2020-04-11 DIAGNOSIS — Z1152 Encounter for screening for COVID-19: Secondary | ICD-10-CM | POA: Diagnosis not present

## 2021-07-27 DIAGNOSIS — Z01419 Encounter for gynecological examination (general) (routine) without abnormal findings: Secondary | ICD-10-CM | POA: Diagnosis not present

## 2021-07-27 DIAGNOSIS — Z6832 Body mass index (BMI) 32.0-32.9, adult: Secondary | ICD-10-CM | POA: Diagnosis not present

## 2021-09-06 DIAGNOSIS — Z3046 Encounter for surveillance of implantable subdermal contraceptive: Secondary | ICD-10-CM | POA: Diagnosis not present

## 2021-11-26 DIAGNOSIS — L259 Unspecified contact dermatitis, unspecified cause: Secondary | ICD-10-CM | POA: Diagnosis not present

## 2022-01-13 DIAGNOSIS — H61031 Chondritis of right external ear: Secondary | ICD-10-CM | POA: Diagnosis not present
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
## 2022-02-24 DIAGNOSIS — J351 Hypertrophy of tonsils: Secondary | ICD-10-CM | POA: Diagnosis not present

## 2022-02-24 DIAGNOSIS — H61031 Chondritis of right external ear: Secondary | ICD-10-CM | POA: Diagnosis not present

## 2022-02-24 DIAGNOSIS — Z23 Encounter for immunization: Secondary | ICD-10-CM | POA: Diagnosis not present

## 2022-02-24 DIAGNOSIS — M25561 Pain in right knee: Secondary | ICD-10-CM | POA: Diagnosis not present

## 2023-03-19 DIAGNOSIS — B9689 Other specified bacterial agents as the cause of diseases classified elsewhere: Secondary | ICD-10-CM | POA: Diagnosis not present

## 2023-03-19 DIAGNOSIS — J028 Acute pharyngitis due to other specified organisms: Secondary | ICD-10-CM | POA: Diagnosis not present

## 2023-03-19 DIAGNOSIS — J029 Acute pharyngitis, unspecified: Secondary | ICD-10-CM | POA: Diagnosis not present

## 2024-02-02 DIAGNOSIS — J358 Other chronic diseases of tonsils and adenoids: Secondary | ICD-10-CM | POA: Diagnosis not present

## 2024-02-02 DIAGNOSIS — T8131XD Disruption of external operation (surgical) wound, not elsewhere classified, subsequent encounter: Secondary | ICD-10-CM | POA: Diagnosis not present

## 2024-02-02 DIAGNOSIS — J351 Hypertrophy of tonsils: Secondary | ICD-10-CM | POA: Diagnosis not present

## 2024-02-20 ENCOUNTER — Other Ambulatory Visit: Payer: Self-pay | Admitting: Otolaryngology

## 2024-03-12 ENCOUNTER — Encounter (HOSPITAL_COMMUNITY): Payer: Self-pay | Admitting: Otolaryngology

## 2024-03-12 ENCOUNTER — Other Ambulatory Visit: Payer: Self-pay

## 2024-03-12 NOTE — Progress Notes (Signed)
 SDW CALL  Patient was given pre-op instructions over the phone. The opportunity was given for the patient to ask questions. No further questions asked. Patient verbalized understanding of instructions given.   PCP - Cheryle Leonel COME Cardiologist - denies  PPM/ICD - Denies Device Orders -  Rep Notified -   Chest x-ray - na EKG - DOS Stress Test - denies ECHO - denies Cardiac Cath - denies  Sleep Study - denies CPAP -   Fasting Blood Sugar - na Checks Blood Sugar _____ times a day  Blood Thinner Instructions:na Aspirin Instructions:na  ERAS Protcol - clear liquids until 0730  PRE-SURGERY Ensure or G2-   COVID TEST- na   Anesthesia review: no  Patient denies shortness of breath, fever, cough and chest pain over the phone call   Oral Hygiene is also important to reduce your risk of infection.  Remember - BRUSH YOUR TEETH THE MORNING OF SURGERY WITH YOUR REGULAR TOOTHPASTE

## 2024-03-12 NOTE — Progress Notes (Signed)
 Called patient to notify her of new surgery time -1036 per Corean Lewis from Dr. Inez office. Pt's new arrival time is 8:00am and she must stop drinking clear liquids at 7:30am. Pt verbally acknowledged understanding of new instructions given.

## 2024-03-13 ENCOUNTER — Other Ambulatory Visit: Payer: Self-pay

## 2024-03-13 ENCOUNTER — Encounter (HOSPITAL_COMMUNITY): Payer: Self-pay | Admitting: Otolaryngology

## 2024-03-13 ENCOUNTER — Encounter: Admission: RE | Disposition: A | Payer: Self-pay | Attending: Otolaryngology

## 2024-03-13 ENCOUNTER — Ambulatory Visit (HOSPITAL_COMMUNITY): Admitting: Anesthesiology

## 2024-03-13 ENCOUNTER — Other Ambulatory Visit (HOSPITAL_COMMUNITY): Payer: Self-pay

## 2024-03-13 ENCOUNTER — Ambulatory Visit (HOSPITAL_COMMUNITY)
Admission: RE | Admit: 2024-03-13 | Discharge: 2024-03-13 | Disposition: A | Attending: Otolaryngology | Admitting: Otolaryngology

## 2024-03-13 DIAGNOSIS — J3501 Chronic tonsillitis: Secondary | ICD-10-CM | POA: Insufficient documentation

## 2024-03-13 DIAGNOSIS — J351 Hypertrophy of tonsils: Secondary | ICD-10-CM | POA: Diagnosis not present

## 2024-03-13 DIAGNOSIS — A4289 Other forms of actinomycosis: Secondary | ICD-10-CM | POA: Diagnosis not present

## 2024-03-13 HISTORY — PX: TONSILLECTOMY: SHX5217

## 2024-03-13 LAB — CBC
HCT: 37.2 % (ref 36.0–46.0)
Hemoglobin: 11.4 g/dL — ABNORMAL LOW (ref 12.0–15.0)
MCH: 23 pg — ABNORMAL LOW (ref 26.0–34.0)
MCHC: 30.6 g/dL (ref 30.0–36.0)
MCV: 75 fL — ABNORMAL LOW (ref 80.0–100.0)
Platelets: 248 K/uL (ref 150–400)
RBC: 4.96 MIL/uL (ref 3.87–5.11)
RDW: 17.4 % — ABNORMAL HIGH (ref 11.5–15.5)
WBC: 4.7 K/uL (ref 4.0–10.5)
nRBC: 0 % (ref 0.0–0.2)

## 2024-03-13 LAB — BASIC METABOLIC PANEL WITH GFR
Anion gap: 12 (ref 5–15)
BUN: 15 mg/dL (ref 6–20)
CO2: 24 mmol/L (ref 22–32)
Calcium: 9.6 mg/dL (ref 8.9–10.3)
Chloride: 101 mmol/L (ref 98–111)
Creatinine, Ser: 0.88 mg/dL (ref 0.44–1.00)
GFR, Estimated: >60 mL/min (ref >60)
Glucose, Bld: 89 mg/dL (ref 70–99)
Potassium: 3.7 mmol/L (ref 3.5–5.1)
Sodium: 137 mmol/L (ref 135–145)

## 2024-03-13 LAB — POCT PREGNANCY, URINE: Preg Test, Ur: NEGATIVE

## 2024-03-13 SURGERY — TONSILLECTOMY
Anesthesia: General | Laterality: Bilateral

## 2024-03-13 MED ORDER — ORAL CARE MOUTH RINSE: 15.0000 mL | Freq: Once | OROMUCOSAL | Status: AC

## 2024-03-13 MED ORDER — ROCURONIUM BROMIDE 10 MG/ML (PF) SYRINGE
PREFILLED_SYRINGE | INTRAVENOUS | Status: DC | PRN
  Administered 2024-03-13: 11:00:00 50 mg via INTRAVENOUS

## 2024-03-13 MED ORDER — FENTANYL CITRATE (PF) 100 MCG/2ML IJ SOLN
INTRAMUSCULAR | Status: DC | PRN
  Administered 2024-03-13 (×2): 50 ug via INTRAVENOUS

## 2024-03-13 MED ORDER — CHLORHEXIDINE GLUCONATE 0.12 % MT SOLN
15.0000 mL | Freq: Once | OROMUCOSAL | Status: AC
  Administered 2024-03-13: 09:00:00 15 mL via OROMUCOSAL

## 2024-03-13 MED ORDER — OXYCODONE HCL 5 MG/5ML PO SOLN
5.0000 mg | Freq: Once | ORAL | Status: AC | PRN
  Administered 2024-03-13: 13:00:00 5 mg via ORAL

## 2024-03-13 MED ORDER — LACTATED RINGERS IV SOLN: INTRAVENOUS | Status: DC

## 2024-03-13 MED ORDER — LIDOCAINE 2% (20 MG/ML) 5 ML SYRINGE
INTRAMUSCULAR | Status: AC
  Filled 2024-03-13: qty 5

## 2024-03-13 MED ORDER — OXYMETAZOLINE HCL 0.05 % NA SOLN
NASAL | Status: DC | PRN
  Administered 2024-03-13: 12:00:00 1 via TOPICAL

## 2024-03-13 MED ORDER — 0.9 % SODIUM CHLORIDE (POUR BTL) OPTIME
TOPICAL | Status: DC | PRN
  Administered 2024-03-13: 12:00:00 1000 mL

## 2024-03-13 MED ORDER — ACETAMINOPHEN 10 MG/ML IV SOLN
INTRAVENOUS | Status: DC | PRN
  Administered 2024-03-13: 12:00:00 1000 mg via INTRAVENOUS

## 2024-03-13 MED ORDER — SUGAMMADEX SODIUM 200 MG/2ML IV SOLN
INTRAVENOUS | Status: DC | PRN
  Administered 2024-03-13: 12:00:00 317.6 mg via INTRAVENOUS

## 2024-03-13 MED ORDER — PROPOFOL 10 MG/ML IV BOLUS
INTRAVENOUS | Status: DC | PRN
  Administered 2024-03-13: 11:00:00 200 mg via INTRAVENOUS

## 2024-03-13 MED ORDER — ROCURONIUM BROMIDE 10 MG/ML (PF) SYRINGE
PREFILLED_SYRINGE | INTRAVENOUS | Status: AC
  Filled 2024-03-13: qty 10

## 2024-03-13 MED ORDER — OXYCODONE HCL 5 MG PO TABS: 5.0000 mg | ORAL_TABLET | Freq: Once | ORAL | Status: AC | PRN

## 2024-03-13 MED ORDER — DEXAMETHASONE SOD PHOSPHATE PF 10 MG/ML IJ SOLN
INTRAMUSCULAR | Status: DC | PRN
  Administered 2024-03-13: 12:00:00 10 mg via INTRAVENOUS

## 2024-03-13 MED ORDER — LIDOCAINE 2% (20 MG/ML) 5 ML SYRINGE
INTRAMUSCULAR | Status: DC | PRN
  Administered 2024-03-13: 11:00:00 50 mg via INTRAVENOUS

## 2024-03-13 MED ORDER — ACETAMINOPHEN 10 MG/ML IV SOLN
INTRAVENOUS | Status: AC
  Filled 2024-03-13: qty 100

## 2024-03-13 MED ORDER — ONDANSETRON HCL 4 MG/2ML IJ SOLN
INTRAMUSCULAR | Status: AC
  Filled 2024-03-13: qty 2

## 2024-03-13 MED ORDER — HYDROMORPHONE HCL 1 MG/ML IJ SOLN
INTRAMUSCULAR | Status: DC | PRN
  Administered 2024-03-13: 12:00:00 .5 mg via INTRAVENOUS

## 2024-03-13 MED ORDER — MIDAZOLAM HCL (PF) 2 MG/2ML IJ SOLN
INTRAMUSCULAR | Status: DC | PRN
  Administered 2024-03-13: 11:00:00 2 mg via INTRAVENOUS

## 2024-03-13 MED ORDER — PROPOFOL 10 MG/ML IV BOLUS
INTRAVENOUS | Status: AC
  Filled 2024-03-13: qty 20

## 2024-03-13 MED ORDER — ONDANSETRON HCL 4 MG/2ML IJ SOLN
INTRAMUSCULAR | Status: DC | PRN
  Administered 2024-03-13: 12:00:00 4 mg via INTRAVENOUS

## 2024-03-13 MED ORDER — ONDANSETRON HCL 4 MG/2ML IJ SOLN: 4.0000 mg | Freq: Four times a day (QID) | INTRAMUSCULAR | Status: DC | PRN

## 2024-03-13 MED ORDER — OXYCODONE HCL 5 MG/5ML PO SOLN
ORAL | Status: AC
  Filled 2024-03-13: qty 5

## 2024-03-13 MED ORDER — FENTANYL CITRATE (PF) 100 MCG/2ML IJ SOLN
INTRAMUSCULAR | Status: AC
  Filled 2024-03-13: qty 2

## 2024-03-13 MED ORDER — DEXMEDETOMIDINE HCL IN NACL 80 MCG/20ML IV SOLN
INTRAVENOUS | Status: DC | PRN
  Administered 2024-03-13: 12:00:00 8 ug via INTRAVENOUS
  Administered 2024-03-13: 12:00:00 4 ug via INTRAVENOUS

## 2024-03-13 MED ORDER — FENTANYL CITRATE (PF) 100 MCG/2ML IJ SOLN: 25.0000 ug | INTRAMUSCULAR | Status: DC | PRN

## 2024-03-13 MED ORDER — MIDAZOLAM HCL 2 MG/2ML IJ SOLN
INTRAMUSCULAR | Status: AC
  Filled 2024-03-13: qty 2

## 2024-03-13 MED FILL — Prednisone Tab 20 MG: ORAL | 4 days supply | Qty: 6 | Fill #0 | Status: AC

## 2024-03-13 MED FILL — Hydrocodone-Acetaminophen Tab 5-325 MG: 1.0000 | ORAL | 7 days supply | Qty: 42 | Fill #0 | Status: AC

## 2024-03-13 MED FILL — Hydromorphone HCl Inj 1 MG/ML: INTRAMUSCULAR | Qty: 0.5 | Status: AC

## 2024-03-13 SURGICAL SUPPLY — 25 items
CANISTER SUCTION 3000ML PPV (SUCTIONS) ×1 IMPLANT
CATH ROBINSON RED A/P 10FR (CATHETERS) IMPLANT
CATH ROBINSON RED A/P 12FR (CATHETERS) ×1 IMPLANT
CLEANER TIP ELECTROSURG 2X2 (MISCELLANEOUS) ×1 IMPLANT
COAGULATOR SUCT SWTCH 10FR 6 (ELECTROSURGICAL) ×1 IMPLANT
CONT SPEC 3O W/LID STRL (MISCELLANEOUS) ×1 IMPLANT
ELECT COATED BLADE 2.86 ST (ELECTRODE) ×1 IMPLANT
ELECTRODE REM PT RETRN 9FT PED (ELECTROSURGICAL) IMPLANT
ELECTRODE REM PT RTRN 9FT ADLT (ELECTROSURGICAL) IMPLANT
GAUZE 4X4 16PLY ~~LOC~~+RFID DBL (SPONGE) ×1 IMPLANT
GLOVE BIO SURGEON STRL SZ 6.5 (GLOVE) ×1 IMPLANT
GOWN STRL REUS W/ TWL LRG LVL3 (GOWN DISPOSABLE) ×2 IMPLANT
KIT BASIN OR (CUSTOM PROCEDURE TRAY) ×1 IMPLANT
KIT TURNOVER KIT B (KITS) ×1 IMPLANT
PACK SRG BSC III STRL LF ECLPS (CUSTOM PROCEDURE TRAY) ×1 IMPLANT
PAD ARMBOARD POSITIONER FOAM (MISCELLANEOUS) IMPLANT
PENCIL SMOKE EVACUATOR (MISCELLANEOUS) ×1 IMPLANT
POSITIONER HEAD DONUT 9IN (MISCELLANEOUS) ×1 IMPLANT
SOLN 0.9% NACL POUR BTL 1000ML (IV SOLUTION) ×1 IMPLANT
SPONGE TONSIL 1.25 RF SGL STRG (GAUZE/BANDAGES/DRESSINGS) ×1 IMPLANT
SYR BULB EAR ULCER 3OZ GRN STR (SYRINGE) ×1 IMPLANT
TOWEL GREEN STERILE FF (TOWEL DISPOSABLE) ×1 IMPLANT
TUBE CONNECTING 12X1/4 (SUCTIONS) ×1 IMPLANT
TUBE SALEM SUMP 16F (TUBING) ×1 IMPLANT
YANKAUER SUCT BULB TIP NO VENT (SUCTIONS) ×1 IMPLANT

## 2024-03-13 NOTE — Op Note (Signed)
 OPERATIVE NOTE  Holly Carlson Date/Time of Admission: 03/13/2024  8:06 AM  CSN: [id number];MRN:[id number] Attending Provider: Llewellyn Sayres A, DO Room/Bed: MCPO/NONE DOB: Sep 02, 2000 Age: 24 y.o.   Pre-Op Diagnosis: Recurrent tonsillitis Tonsillar hypertrophy  Post-Op Diagnosis: Recurrent tonsillitisTonsillar hypertrophy  Procedure: Procedures: TONSILLECTOMY  Anesthesia: General  Surgeon(s): Sayres DELENA Llewellyn, DO  Staff: Circulator: Primus Leita HERO, RN Relief Circulator: Tharon Selinda GAILS, RN Relief Scrub: Gwenn Mardy SQUIBB, RN Scrub Person: Perez-Vasquez, Tiffany  Implants: * No implants in log *  Specimens: ID Type Source Tests Collected by Time Destination  1 : Right Tonsil ENT Tonsil, Right SURGICAL PATHOLOGY Natasa Stigall A, DO 03/13/2024 1143   2 : Left Tonsil ENT Tonsil, Left SURGICAL PATHOLOGY Daveon Arpino A, DO 03/13/2024 1144     Complications: None  EBL: 5 ML  Condition: stable  Operative Findings:  Extremely inflamed left tonsil with existing defect in lateral soft palate from previous incision and drainage, tonsil stones present bilaterally  Description of Operation: Once operative consent was obtained, and the surgical site confirmed with the operating room team, the patient was brought back to the operating room and general endotracheal anesthesia was obtained. The patient was turned over to the ENT service. A Crow-Davis mouth gag was used to expose the oral cavity and oropharynx. A red rubber catheter was placed from the right nasal cavity to the oral cavity to retract the soft palate. Attention was first turned to the right tonsil, which was excised at the level of the capsule using electrocautery. Hemostasis was obtained and the tonsillar fossa and the tonsil was sent for specimen. The exact procedure was repeated on the left side. The patient was relieved from oral suspension and then placed back in oral suspension to assure hemostasis, which  was obtained. An oral gastric tube was placed into the stomach and suctioned to reduce postoperative nausea. The patient was turned back over to the anesthesia service and extubated in the room without event. The patient was then transferred to the PACU in stable condition.     Sayres DELENA Llewellyn, DO [facility]  03/13/2024

## 2024-03-13 NOTE — Anesthesia Preprocedure Evaluation (Signed)
Anesthesia Evaluation  Patient identified by MRN, date of birth, ID band Patient awake    Reviewed: Allergy & Precautions, H&P , NPO status , Patient's Chart, lab work & pertinent test results  Airway Mallampati: II   Neck ROM: full    Dental   Pulmonary neg pulmonary ROS   breath sounds clear to auscultation       Cardiovascular negative cardio ROS  Rhythm:regular Rate:Normal     Neuro/Psych  PSYCHIATRIC DISORDERS Anxiety Depression       GI/Hepatic   Endo/Other    Renal/GU      Musculoskeletal   Abdominal   Peds  Hematology   Anesthesia Other Findings   Reproductive/Obstetrics                             Anesthesia Physical Anesthesia Plan  ASA: 2  Anesthesia Plan: General   Post-op Pain Management:    Induction: Intravenous  PONV Risk Score and Plan: 3 and Ondansetron, Dexamethasone, Midazolam and Treatment may vary due to age or medical condition  Airway Management Planned: Oral ETT  Additional Equipment:   Intra-op Plan:   Post-operative Plan: Extubation in OR  Informed Consent: I have reviewed the patients History and Physical, chart, labs and discussed the procedure including the risks, benefits and alternatives for the proposed anesthesia with the patient or authorized representative who has indicated his/her understanding and acceptance.     Dental advisory given  Plan Discussed with: CRNA, Anesthesiologist and Surgeon  Anesthesia Plan Comments:        Anesthesia Quick Evaluation

## 2024-03-13 NOTE — H&P (Signed)
 Holly Carlson is an 23 y.o. female.    Chief Complaint:  Chronic tonsillitis  HPI: Patient presents today for planned elective procedure. She denies any interval change in history since office visit on 02/02/24:   Holly Carlson is a 23 y.o. female who presents as a new patient, for evaluation and treatment of chronic tonsillitis. Patient states that she had a left-sided peritonsillar abscess in 2019 which required incision and drainage, and since that time she has had issues with recurrent inflammation and swelling of the left tonsil. She is unsure if she gets tonsil stones, but states that on multiple occasions, she has noted left-sided throat pain and swelling which made it uncomfortable to eat and swallow. She also states that she will notice a foul tasting drainage coming from that side during these episodes. She states that both times, the symptoms gradually went away on their own without any treatment with oral antibiotics. She denies history of allergic rhinitis or nasal congestion. She denies family history of a bleeding disorder.   Patient was initially seen for her symptoms in January 2022, and at that time surgery was recommended, but not ultimately pursued. Patient notes that since that time, she has experienced increased frequency of recurrent throat infections, now occurring approximately 3-4 times per year.   Past Medical History:  Diagnosis Date   Anemia    Concussion 2017   Palpitations     Past Surgical History:  Procedure Laterality Date   ABSCESS DRAINAGE  2019   throat   HAND SURGERY Left    WISDOM TOOTH EXTRACTION      Family History  Problem Relation Age of Onset   Healthy Sister    Healthy Brother    Healthy Brother     Social History:  reports that she has never smoked. She has never used smokeless tobacco. She reports current alcohol use of about 1.0 standard drink of alcohol per week. She reports that she does not use drugs.  Allergies:  Allergies[1]  Medications Prior to Admission  Medication Sig Dispense Refill   Etonogestrel  (NEXPLANON  [place]) Nexplanon      norethindrone (AYGESTIN) 5 MG tablet norethindrone acetate 1 mg-ethinyl estradiol 20 mcg tablet (Patient not taking: Reported on 03/11/2024)      No results found for this or any previous visit (from the past 48 hours). No results found.  ROS: ROS  Blood pressure 115/69, pulse 71, temperature 98.2 F (36.8 C), temperature source Oral, resp. rate 18, height 5' 6 (1.676 m), weight 79.4 kg, SpO2 99%.  PHYSICAL EXAM: Physical Exam Constitutional:      Appearance: Normal appearance.  Pulmonary:     Effort: Pulmonary effort is normal.  Neurological:     General: No focal deficit present.     Mental Status: She is alert. Mental status is at baseline.  Psychiatric:        Mood and Affect: Mood normal.        Behavior: Behavior normal.     Studies Reviewed: None   Assessment/Plan Holly Carlson is a 23 y.o. female with history of left peritonsillar abscess requiring incision and drainage with ongoing symptoms of predominantly left-sided throat pain and odynophagia. Examination today demonstrates bilateral tonsillar hypertrophy with cryptic tonsils. Left soft palate demonstrates persistent opening from previous incision and drainage site through which scant fluid and tonsil stones were visualized.  -To OR today for tonsillectomy. The risks, benefits and possible complications of the procedure were reviewed in detail with the patient. Postoperative risks of dehydration,  infection, and bleeding were reviewed in detail. The anticipated 10-14 day recovery was emphasized. All questions were answered.     Garcia Dalzell A Zayonna Ayuso 03/13/2024, 8:39 AM       [1] No Known Allergies

## 2024-03-13 NOTE — Discharge Instructions (Signed)
 Tonsillectomy Post Operative Instructions  [phone] [facility]Main ENT office number  Effects of Anesthesia Tonsillectomy (with or without Adenoidectomy) involves a brief anesthesia, typically 20 - 60 minutes. Patients may be quite irritable for several hours after surgery. If sedatives were given, some patients will remain sleepy for much of the day. Nausea and vomiting is occasionally seen, and usually resolves by the evening of surgery - even without additional medications.  Medications Tonsillectomy is a painful procedure. Pain medications help but do not completely alleviate the discomfort.   ADULTS  Adults will be prescribed a narcotic pain pill or elixir (Percocet, Norco, Vicodin, Lortab are some examples). Do not use aspirin products (Bayers, Goode powders, Excedrin) - they may increase the chance of bleeding. Every time you take a dose of pain medication, do so with some food or full liquid to prevent nausea. The best thing to take with the medication is a cup of pudding or ice cream, a milkshake or cup of milk.   In addition to the narcotic, you may take 800mg  of Ibuprofen every 8 hours, and an additional 500mg  of Tylenol  every 8 hours.  Limit Acetaminophen /Tylenol  to less than 4,000mg /day   Limit Ibuprofen/Motrin to less than 3,200mg /day  Activity  Vigorous exercise should be avoided for 14 days after surgery. This risk of bleeding is increased with increased activity and bleeding from where the tonsils were removed can happen for up to 2 weeks after surgery. Baths and showers are fine. Many patients have reduced energy levels until their pain decreases and they are taking in more nourishment and calories. You should not travel out of the local area for a full 2 weeks after surgery in case you experience bleeding after surgery.   Eating & Drinking Dehydration is the biggest enemy in the recovery period. It will increase the pain, increase the risk of bleeding and delay the healing.  It usually happens because the pain of swallowing keeps the patient from drinking enough liquids. Therefore, the key is to force fluids, and that works best when pain control is maximized. You cannot drink too much after having a tonsillectomy. The only drinks to avoid are citrus like orange and grapefruit juices because they will burn the back of the throat. Incentive charts with prizes work very well to get young children to drink fluids and take their medications after surgery. Some patients will have a small amount of liquid come out of their nose when they drink after surgery, this should stop within a few weeks after surgery. Although drinking is more important, eating is fine even the day of surgery but avoid foods that are crunchy or have sharp edges. Dairy products may be taken, if desired. You should avoid acidic, salty and spicy foods (especially tomato sauces). Chewing gum or bubble gum encourages swallowing and saliva flow, and may even speed up the healing. Almost everyone loses some weight after tonsillectomy (which is usually regained in the 2nd or 3rd week after surgery).   Drinking is far more important that eating in the first 14 days after surgery, so concentrate on that first and foremost. Adequate liquid intake probably speeds recovery.  Other things  Pain is usually the worst in the morning; this can be avoided by overnight medication administration if needed.  Since moisture helps soothe the healing throat, a room humidifier (hot or cold) is suggested when the patient is sleeping.  Some patients feel pain relief with an ice collar to the neck (or a bag of  frozen peas or corn). Be careful to avoid placing cold plastic directly on the skin - wrap in a paper towel or washcloth.   If the tonsils and adenoids are very large, the patients voice may change after surgery.  The recovery from tonsillectomy is a very painful period, often the worst pain people can recall, so please be  understanding and patient with yourself, or the patient you are caring for. It is helpful to take pain medicine during the night if the patient awakens-- the worst pain is usually in the morning. The pain may seem to increase 2-5 days after surgery -this is normal when inflammation sets in. Please be aware that no combination of medicines will eliminate the pain - the patient will need to continue eating/drinking in spite of the remaining discomfort.  You should not travel outside of the local area for 14 days after surgery in case significant bleeding occurs.   What should we expect after surgery? As previously mentioned, most patients have a significant amount of pain after tonsillectomy, with pain resolving 7-14 days after surgery. Older children and adults seem to have more discomfort. Most patients can go home the day of surgery.  Ear pain: Many people will complain of earaches after tonsillectomy. This is caused by referred pain coming from throat and not the ears. Give pain medications and encourage liquid intake.  Fever: Many patients have a low-grade fever after tonsillectomy - up to 101.5 degrees (380 C.) for several days. Higher prolonged fever should be reported to your surgeon.  Bad looking (and bad smelling) throat: After surgery, the place where the tonsils were removed is covered with a white film, which is a moist scab. This usually develops 3-5 days after surgery and falls off 10-14 days after surgery and usually causes bad breath. There will be some redness and swelling as well. The uvula (the part of the throat that hangs down in the middle between the tonsils) is usually swollen for several days after surgery.  Sore/bruised feeling of Tongue: This is common for the first few days after surgery because the tongue is pushed out of the way to take out the tonsils in surgery.  When should we call the doctor?  Nausea/Vomiting: This is a common side effect from General Anesthesia and can  last up to 24-36 hours after surgery. Try giving sips of clear liquids like Sprite, water or apple juice then gradually increase fluid intake. If the nausea or vomiting continues beyond this time frame, call the doctors office for medications that will help relieve the nausea and vomiting.   Bleeding: Significant bleeding is rare, but it happens to about 3% of patients who have tonsillectomy. It may come from the nose, the mouth, or be vomited or coughed up. Ice water mouthwashes may help stop or reduce bleeding. If you have bleeding that does not stop, you should call the office (during business hours) or the on call physician (evenings,weekends) or go to the emergency room if you are very concerned.    Dehydration: If there has been little or no liquids intake for 24 hours, the patient may need to come to the hospital for IV fluids. Signs of dehydration include lethargy, the lack of tears when crying, and reduced or very concentrated urine output.   High Fever: If the patient has a consistent temperatures greater than 102, or when accompanied by cough or difficulty breathing, you should call the doctors office.

## 2024-03-13 NOTE — Anesthesia Procedure Notes (Signed)
 Procedure Name: Intubation Date/Time: 03/13/2024 11:31 AM  Performed by: Jerl Donald LABOR, CRNAPre-anesthesia Checklist: Patient identified, Emergency Drugs available, Suction available and Patient being monitored Patient Re-evaluated:Patient Re-evaluated prior to induction Oxygen Delivery Method: Circle System Utilized Preoxygenation: Pre-oxygenation with 100% oxygen Induction Type: IV induction Ventilation: Mask ventilation without difficulty Laryngoscope Size: Mac and 3 Grade View: Grade I Tube type: Oral Tube size: 6.5 mm Number of attempts: 1 Placement Confirmation: ETT inserted through vocal cords under direct vision, positive ETCO2 and breath sounds checked- equal and bilateral Secured at: 20 cm Tube secured with: Tape Dental Injury: Teeth and Oropharynx as per pre-operative assessment

## 2024-03-13 NOTE — Transfer of Care (Signed)
 Immediate Anesthesia Transfer of Care Note  Patient: Ramiya Delahunty  Procedure(s) Performed: TONSILLECTOMY (Bilateral)  Patient Location: PACU  Anesthesia Type:General  Level of Consciousness: drowsy  Airway & Oxygen Therapy: Patient Spontanous Breathing  Post-op Assessment: Report given to RN, Post -op Vital signs reviewed and stable, and Patient moving all extremities  Post vital signs: Reviewed and stable  Last Vitals:  Vitals Value Taken Time  BP 130/77 03/13/24 12:11  Temp    Pulse 91 03/13/24 12:12  Resp 13 03/13/24 12:12  SpO2 98 % 03/13/24 12:12  Vitals shown include unfiled device data.  Last Pain:  Vitals:   03/13/24 0833  TempSrc:   PainSc: 0-No pain      Patients Stated Pain Goal: 0 (03/13/24 9166)  Complications: No notable events documented.

## 2024-03-14 ENCOUNTER — Encounter (HOSPITAL_COMMUNITY): Payer: Self-pay | Admitting: Otolaryngology

## 2024-03-14 LAB — SURGICAL PATHOLOGY

## 2024-03-18 NOTE — Anesthesia Postprocedure Evaluation (Signed)
"   Anesthesia Post Note  Patient: Holly Carlson  Procedure(s) Performed: TONSILLECTOMY (Bilateral)     Patient location during evaluation: PACU Anesthesia Type: General Level of consciousness: awake and alert Pain management: pain level controlled Vital Signs Assessment: post-procedure vital signs reviewed and stable Respiratory status: spontaneous breathing, nonlabored ventilation, respiratory function stable and patient connected to nasal cannula oxygen Cardiovascular status: blood pressure returned to baseline and stable Postop Assessment: no apparent nausea or vomiting Anesthetic complications: no   No notable events documented.  Last Vitals:  Vitals:   03/13/24 1315 03/13/24 1330  BP: 119/81 115/73  Pulse: 82 76  Resp: 14 14  Temp:  37 C  SpO2: 99% 98%    Last Pain:  Vitals:   03/13/24 1330  TempSrc:   PainSc: 4                  Hakeem Frazzini S      "
